# Patient Record
Sex: Male | Born: 1970 | Race: White | Hispanic: No | Marital: Married | Smoking: Never smoker
Health system: Southern US, Community
[De-identification: ages and names within clinical notes are randomized; demographics above are authoritative.]

## PROBLEM LIST (undated history)

## (undated) DIAGNOSIS — R200 Anesthesia of skin: Secondary | ICD-10-CM

## (undated) DIAGNOSIS — M545 Low back pain, unspecified: Secondary | ICD-10-CM

---

## 2020-04-10 ENCOUNTER — Other Ambulatory Visit: Payer: Self-pay | Admitting: Neurosurgery

## 2020-05-02 ENCOUNTER — Encounter (HOSPITAL_COMMUNITY): Payer: Self-pay | Admitting: Neurosurgery

## 2020-05-02 NOTE — Progress Notes (Signed)
Patient denies shortness of breath, fever, cough or chest pain.  PCP - Dr Samuel Germany, Marye Round,  Cardiologist - n/a  Chest x-ray - n/a EKG - n/a Stress Test - n/a ECHO - n/a Cardiac Cath - n/a  STOP now taking any Aspirin (unless otherwise instructed by your surgeon), Aleve, Naproxen, Ibuprofen, Motrin, Advil, Goody's, BC's, all herbal medications, fish oil, and all vitamins.   Coronavirus Screening Covid test on DOS 05/05/20. Do you have any of the following symptoms:  Cough yes/no: No Fever (>100.80F)  yes/no: No Runny nose yes/no: No Sore throat yes/no: No Difficulty breathing/shortness of breath  yes/no: No  Have you traveled in the last 14 days and where? yes/no: No  Patient verbalized understanding of instructions that were given via phone.

## 2020-05-04 NOTE — Anesthesia Preprocedure Evaluation (Addendum)
Anesthesia Evaluation  Patient identified by MRN, date of birth, ID band Patient awake    Reviewed: Allergy & Precautions, NPO status , Patient's Chart, lab work & pertinent test results  Airway Mallampati: II  TM Distance: >3 FB Neck ROM: Full    Dental no notable dental hx. (+) Dental Advisory Given   Pulmonary neg pulmonary ROS,    Pulmonary exam normal breath sounds clear to auscultation       Cardiovascular negative cardio ROS Normal cardiovascular exam Rhythm:Regular Rate:Normal     Neuro/Psych negative neurological ROS  negative psych ROS   GI/Hepatic negative GI ROS, Neg liver ROS,   Endo/Other  negative endocrine ROS  Renal/GU negative Renal ROS     Musculoskeletal negative musculoskeletal ROS (+)   Abdominal   Peds  Hematology negative hematology ROS (+)   Anesthesia Other Findings   Reproductive/Obstetrics                            Anesthesia Physical Anesthesia Plan  ASA: II  Anesthesia Plan: General   Post-op Pain Management:    Induction: Intravenous  PONV Risk Score and Plan: 3 and Ondansetron, Dexamethasone, Treatment may vary due to age or medical condition and Midazolam  Airway Management Planned: Oral ETT and Video Laryngoscope Planned  Additional Equipment:   Intra-op Plan:   Post-operative Plan: Extubation in OR  Informed Consent: I have reviewed the patients History and Physical, chart, labs and discussed the procedure including the risks, benefits and alternatives for the proposed anesthesia with the patient or authorized representative who has indicated his/her understanding and acceptance.     Dental advisory given  Plan Discussed with: CRNA  Anesthesia Plan Comments:        Anesthesia Quick Evaluation

## 2020-05-05 ENCOUNTER — Ambulatory Visit (HOSPITAL_COMMUNITY)
Admission: RE | Admit: 2020-05-05 | Discharge: 2020-05-06 | Disposition: A | Discharge status: Home / Self Care | Payer: BC Managed Care – PPO | Attending: Neurosurgery | Admitting: Neurosurgery

## 2020-05-05 ENCOUNTER — Other Ambulatory Visit: Payer: Self-pay

## 2020-05-05 ENCOUNTER — Encounter (HOSPITAL_COMMUNITY): Payer: Self-pay | Admitting: Neurosurgery

## 2020-05-05 ENCOUNTER — Encounter (HOSPITAL_COMMUNITY)
Admission: RE | Disposition: A | Discharge status: Home / Self Care | Payer: Self-pay | Source: Home / Self Care | Attending: Neurosurgery

## 2020-05-05 ENCOUNTER — Ambulatory Visit (HOSPITAL_COMMUNITY): Payer: BC Managed Care – PPO | Admitting: Certified Registered Nurse Anesthetist

## 2020-05-05 ENCOUNTER — Ambulatory Visit (HOSPITAL_COMMUNITY): Payer: BC Managed Care – PPO

## 2020-05-05 DIAGNOSIS — M5116 Intervertebral disc disorders with radiculopathy, lumbar region: Secondary | ICD-10-CM | POA: Diagnosis not present

## 2020-05-05 DIAGNOSIS — M5126 Other intervertebral disc displacement, lumbar region: Secondary | ICD-10-CM | POA: Diagnosis present

## 2020-05-05 DIAGNOSIS — Z419 Encounter for procedure for purposes other than remedying health state, unspecified: Secondary | ICD-10-CM

## 2020-05-05 DIAGNOSIS — Z20822 Contact with and (suspected) exposure to covid-19: Secondary | ICD-10-CM | POA: Insufficient documentation

## 2020-05-05 DIAGNOSIS — Z79899 Other long term (current) drug therapy: Secondary | ICD-10-CM | POA: Diagnosis not present

## 2020-05-05 HISTORY — DX: Anesthesia of skin: R20.0

## 2020-05-05 HISTORY — DX: Low back pain, unspecified: M54.50

## 2020-05-05 HISTORY — PX: LUMBAR LAMINECTOMY/DECOMPRESSION MICRODISCECTOMY: SHX5026

## 2020-05-05 LAB — CBC
HCT: 44.6 % (ref 39.0–52.0)
Hemoglobin: 14.8 g/dL (ref 13.0–17.0)
MCH: 30 pg (ref 26.0–34.0)
MCHC: 33.2 g/dL (ref 30.0–36.0)
MCV: 90.5 fL (ref 80.0–100.0)
Platelets: 216 10*3/uL (ref 150–400)
RBC: 4.93 MIL/uL (ref 4.22–5.81)
RDW: 13.5 % (ref 11.5–15.5)
WBC: 5.5 10*3/uL (ref 4.0–10.5)
nRBC: 0 % (ref 0.0–0.2)

## 2020-05-05 LAB — SARS CORONAVIRUS 2 BY RT PCR (HOSPITAL ORDER, PERFORMED IN ~~LOC~~ HOSPITAL LAB): SARS Coronavirus 2: NEGATIVE

## 2020-05-05 SURGERY — LUMBAR LAMINECTOMY/DECOMPRESSION MICRODISCECTOMY 1 LEVEL
Anesthesia: General | Site: Spine Lumbar | Laterality: Left

## 2020-05-05 MED ORDER — PANTOPRAZOLE SODIUM 40 MG IV SOLR: 40.0000 mg | Freq: Every day | INTRAVENOUS | Status: DC

## 2020-05-05 MED ORDER — 0.9 % SODIUM CHLORIDE (POUR BTL) OPTIME
TOPICAL | Status: DC | PRN
  Administered 2020-05-05: 1000 mL

## 2020-05-05 MED ORDER — ACETAMINOPHEN 650 MG RE SUPP: 650.0000 mg | RECTAL | Status: DC | PRN

## 2020-05-05 MED ORDER — SODIUM CHLORIDE 0.9% FLUSH: 3.0000 mL | INTRAVENOUS | Status: DC | PRN

## 2020-05-05 MED ORDER — HYDROMORPHONE HCL 1 MG/ML IJ SOLN
INTRAMUSCULAR | Status: AC
  Filled 2020-05-05: qty 1

## 2020-05-05 MED ORDER — ROCURONIUM BROMIDE 10 MG/ML (PF) SYRINGE
PREFILLED_SYRINGE | INTRAVENOUS | Status: AC
  Filled 2020-05-05: qty 10

## 2020-05-05 MED ORDER — GABAPENTIN 300 MG PO CAPS: 300.0000 mg | ORAL_CAPSULE | Freq: Three times a day (TID) | ORAL | Status: DC | PRN

## 2020-05-05 MED ORDER — SODIUM CHLORIDE 0.9 % IV SOLN: 250.0000 mL | INTRAVENOUS | Status: DC

## 2020-05-05 MED ORDER — PROPOFOL 10 MG/ML IV BOLUS
INTRAVENOUS | Status: AC
  Filled 2020-05-05: qty 40

## 2020-05-05 MED ORDER — SODIUM CHLORIDE 0.9% FLUSH
3.0000 mL | Freq: Two times a day (BID) | INTRAVENOUS | Status: DC
  Administered 2020-05-05: 3 mL via INTRAVENOUS

## 2020-05-05 MED ORDER — LIDOCAINE-EPINEPHRINE 1 %-1:100000 IJ SOLN
INTRAMUSCULAR | Status: AC
  Filled 2020-05-05: qty 1

## 2020-05-05 MED ORDER — MENTHOL 3 MG MT LOZG: 1.0000 | LOZENGE | OROMUCOSAL | Status: DC | PRN

## 2020-05-05 MED ORDER — DEXAMETHASONE SODIUM PHOSPHATE 10 MG/ML IJ SOLN
INTRAMUSCULAR | Status: AC
  Filled 2020-05-05: qty 1

## 2020-05-05 MED ORDER — LACTATED RINGERS IV SOLN: INTRAVENOUS | Status: DC | PRN

## 2020-05-05 MED ORDER — CEFAZOLIN SODIUM-DEXTROSE 2-3 GM-%(50ML) IV SOLR
INTRAVENOUS | Status: DC | PRN
  Administered 2020-05-05: 2 g via INTRAVENOUS

## 2020-05-05 MED ORDER — MEPERIDINE HCL 25 MG/ML IJ SOLN: 6.2500 mg | INTRAMUSCULAR | Status: DC | PRN

## 2020-05-05 MED ORDER — THROMBIN 5000 UNITS EX SOLR
CUTANEOUS | Status: DC | PRN
  Administered 2020-05-05 (×2): 5000 [IU] via TOPICAL

## 2020-05-05 MED ORDER — PHENOL 1.4 % MT LIQD: 1.0000 | OROMUCOSAL | Status: DC | PRN

## 2020-05-05 MED ORDER — FENTANYL CITRATE (PF) 250 MCG/5ML IJ SOLN
INTRAMUSCULAR | Status: AC
  Filled 2020-05-05: qty 5

## 2020-05-05 MED ORDER — ONDANSETRON HCL 4 MG/2ML IJ SOLN
INTRAMUSCULAR | Status: AC
  Filled 2020-05-05: qty 2

## 2020-05-05 MED ORDER — HYDROMORPHONE HCL 1 MG/ML IJ SOLN
0.2500 mg | INTRAMUSCULAR | Status: DC | PRN
  Administered 2020-05-05 (×2): 0.5 mg via INTRAVENOUS

## 2020-05-05 MED ORDER — LIDOCAINE 2% (20 MG/ML) 5 ML SYRINGE
INTRAMUSCULAR | Status: AC
  Filled 2020-05-05: qty 5

## 2020-05-05 MED ORDER — CHLORHEXIDINE GLUCONATE 0.12 % MT SOLN
15.0000 mL | Freq: Once | OROMUCOSAL | Status: AC
  Administered 2020-05-05: 15 mL via OROMUCOSAL
  Filled 2020-05-05: qty 15

## 2020-05-05 MED ORDER — MIDAZOLAM HCL 5 MG/5ML IJ SOLN
INTRAMUSCULAR | Status: DC | PRN
  Administered 2020-05-05: 2 mg via INTRAVENOUS

## 2020-05-05 MED ORDER — PROPOFOL 10 MG/ML IV BOLUS
INTRAVENOUS | Status: DC | PRN
  Administered 2020-05-05: 200 mg via INTRAVENOUS

## 2020-05-05 MED ORDER — TRAMADOL HCL 50 MG PO TABS: 50.0000 mg | ORAL_TABLET | Freq: Four times a day (QID) | ORAL | Status: DC | PRN

## 2020-05-05 MED ORDER — ONDANSETRON HCL 4 MG PO TABS: 4.0000 mg | ORAL_TABLET | Freq: Four times a day (QID) | ORAL | Status: DC | PRN

## 2020-05-05 MED ORDER — LIDOCAINE-EPINEPHRINE 1 %-1:100000 IJ SOLN
INTRAMUSCULAR | Status: DC | PRN
  Administered 2020-05-05: 10 mL

## 2020-05-05 MED ORDER — FENTANYL CITRATE (PF) 100 MCG/2ML IJ SOLN
INTRAMUSCULAR | Status: DC | PRN
  Administered 2020-05-05: 50 ug via INTRAVENOUS
  Administered 2020-05-05: 100 ug via INTRAVENOUS

## 2020-05-05 MED ORDER — MIDAZOLAM HCL 2 MG/2ML IJ SOLN
INTRAMUSCULAR | Status: AC
  Filled 2020-05-05: qty 2

## 2020-05-05 MED ORDER — LACTATED RINGERS IV SOLN: INTRAVENOUS | Status: DC

## 2020-05-05 MED ORDER — SUGAMMADEX SODIUM 200 MG/2ML IV SOLN
INTRAVENOUS | Status: DC | PRN
  Administered 2020-05-05: 200 mg via INTRAVENOUS
  Administered 2020-05-05: 100 mg via INTRAVENOUS

## 2020-05-05 MED ORDER — BUPIVACAINE HCL (PF) 0.25 % IJ SOLN
INTRAMUSCULAR | Status: DC | PRN
  Administered 2020-05-05: 10 mL

## 2020-05-05 MED ORDER — LIDOCAINE HCL (CARDIAC) PF 100 MG/5ML IV SOSY
PREFILLED_SYRINGE | INTRAVENOUS | Status: DC | PRN
  Administered 2020-05-05: 100 mg via INTRAVENOUS

## 2020-05-05 MED ORDER — HEMOSTATIC AGENTS (NO CHARGE) OPTIME
TOPICAL | Status: DC | PRN
  Administered 2020-05-05: 1 via TOPICAL

## 2020-05-05 MED ORDER — ONDANSETRON HCL 4 MG/2ML IJ SOLN: 4.0000 mg | Freq: Four times a day (QID) | INTRAMUSCULAR | Status: DC | PRN

## 2020-05-05 MED ORDER — BUPIVACAINE HCL (PF) 0.25 % IJ SOLN
INTRAMUSCULAR | Status: AC
  Filled 2020-05-05: qty 30

## 2020-05-05 MED ORDER — CEFAZOLIN SODIUM-DEXTROSE 2-4 GM/100ML-% IV SOLN
2.0000 g | Freq: Three times a day (TID) | INTRAVENOUS | Status: AC
  Administered 2020-05-05 (×2): 2 g via INTRAVENOUS
  Filled 2020-05-05 (×2): qty 100

## 2020-05-05 MED ORDER — THROMBIN 5000 UNITS EX SOLR
CUTANEOUS | Status: AC
  Filled 2020-05-05: qty 10000

## 2020-05-05 MED ORDER — ACETAMINOPHEN 325 MG PO TABS: 650.0000 mg | ORAL_TABLET | ORAL | Status: DC | PRN

## 2020-05-05 MED ORDER — ORAL CARE MOUTH RINSE: 15.0000 mL | Freq: Once | OROMUCOSAL | Status: AC

## 2020-05-05 MED ORDER — OXYCODONE HCL 5 MG PO TABS
10.0000 mg | ORAL_TABLET | ORAL | Status: DC | PRN
  Administered 2020-05-05 – 2020-05-06 (×5): 10 mg via ORAL
  Filled 2020-05-05 (×5): qty 2

## 2020-05-05 MED ORDER — ONDANSETRON HCL 4 MG/2ML IJ SOLN
INTRAMUSCULAR | Status: DC | PRN
  Administered 2020-05-05: 4 mg via INTRAVENOUS

## 2020-05-05 MED ORDER — SODIUM CHLORIDE 0.9 % IV SOLN
INTRAVENOUS | Status: DC | PRN
  Administered 2020-05-05: 500 mL

## 2020-05-05 MED ORDER — HYDROMORPHONE HCL 1 MG/ML IJ SOLN
0.5000 mg | INTRAMUSCULAR | Status: DC | PRN
  Administered 2020-05-05 (×2): 0.5 mg via INTRAVENOUS
  Filled 2020-05-05 (×2): qty 0.5

## 2020-05-05 MED ORDER — DEXAMETHASONE SODIUM PHOSPHATE 10 MG/ML IJ SOLN
INTRAMUSCULAR | Status: DC | PRN
Start: 2020-05-05 — End: 2020-05-05
  Administered 2020-05-05: 10 mg via INTRAVENOUS

## 2020-05-05 MED ORDER — CEFAZOLIN SODIUM-DEXTROSE 2-4 GM/100ML-% IV SOLN
INTRAVENOUS | Status: AC
  Filled 2020-05-05: qty 100

## 2020-05-05 MED ORDER — CYCLOBENZAPRINE HCL 10 MG PO TABS
10.0000 mg | ORAL_TABLET | Freq: Three times a day (TID) | ORAL | Status: DC | PRN
  Administered 2020-05-05 – 2020-05-06 (×3): 10 mg via ORAL
  Filled 2020-05-05 (×3): qty 1

## 2020-05-05 MED ORDER — ROCURONIUM BROMIDE 10 MG/ML (PF) SYRINGE
PREFILLED_SYRINGE | INTRAVENOUS | Status: DC | PRN
  Administered 2020-05-05: 10 mg via INTRAVENOUS
  Administered 2020-05-05: 60 mg via INTRAVENOUS
  Administered 2020-05-05 (×2): 10 mg via INTRAVENOUS

## 2020-05-05 MED ORDER — PANTOPRAZOLE SODIUM 40 MG PO TBEC
40.0000 mg | DELAYED_RELEASE_TABLET | Freq: Every day | ORAL | Status: DC
  Administered 2020-05-05: 40 mg via ORAL
  Filled 2020-05-05: qty 1

## 2020-05-05 MED ORDER — PROMETHAZINE HCL 25 MG/ML IJ SOLN: 6.2500 mg | INTRAMUSCULAR | Status: DC | PRN

## 2020-05-05 MED ORDER — ALUM & MAG HYDROXIDE-SIMETH 200-200-20 MG/5ML PO SUSP: 30.0000 mL | Freq: Four times a day (QID) | ORAL | Status: DC | PRN

## 2020-05-05 SURGICAL SUPPLY — 54 items
BAG DECANTER FOR FLEXI CONT (MISCELLANEOUS) ×3 IMPLANT
BAND RUBBER #18 3X1/16 STRL (MISCELLANEOUS) ×6 IMPLANT
BENZOIN TINCTURE PRP APPL 2/3 (GAUZE/BANDAGES/DRESSINGS) ×3 IMPLANT
BLADE CLIPPER SURG (BLADE) IMPLANT
BLADE SURG 11 STRL SS (BLADE) ×3 IMPLANT
BUR CUTTER 7.0 ROUND (BURR) ×3 IMPLANT
BUR MATCHSTICK NEURO 3.0 LAGG (BURR) ×3 IMPLANT
CANISTER SUCT 3000ML PPV (MISCELLANEOUS) ×3 IMPLANT
CARTRIDGE OIL MAESTRO DRILL (MISCELLANEOUS) ×1 IMPLANT
CLOSURE WOUND 1/2 X4 (GAUZE/BANDAGES/DRESSINGS) ×1
COVER WAND RF STERILE (DRAPES) IMPLANT
DECANTER SPIKE VIAL GLASS SM (MISCELLANEOUS) ×3 IMPLANT
DERMABOND ADVANCED (GAUZE/BANDAGES/DRESSINGS) ×2
DERMABOND ADVANCED .7 DNX12 (GAUZE/BANDAGES/DRESSINGS) ×1 IMPLANT
DIFFUSER DRILL AIR PNEUMATIC (MISCELLANEOUS) ×3 IMPLANT
DRAPE HALF SHEET 40X57 (DRAPES) IMPLANT
DRAPE LAPAROTOMY 100X72X124 (DRAPES) ×3 IMPLANT
DRAPE MICROSCOPE LEICA (MISCELLANEOUS) ×3 IMPLANT
DRAPE SURG 17X23 STRL (DRAPES) ×3 IMPLANT
DRSG OPSITE POSTOP 3X4 (GAUZE/BANDAGES/DRESSINGS) IMPLANT
DRSG OPSITE POSTOP 4X6 (GAUZE/BANDAGES/DRESSINGS) ×3 IMPLANT
DURAPREP 26ML APPLICATOR (WOUND CARE) ×3 IMPLANT
ELECT REM PT RETURN 9FT ADLT (ELECTROSURGICAL) ×3
ELECTRODE REM PT RTRN 9FT ADLT (ELECTROSURGICAL) ×1 IMPLANT
GAUZE 4X4 16PLY RFD (DISPOSABLE) IMPLANT
GAUZE SPONGE 4X4 12PLY STRL (GAUZE/BANDAGES/DRESSINGS) IMPLANT
GLOVE BIO SURGEON STRL SZ7 (GLOVE) ×3 IMPLANT
GLOVE BIO SURGEON STRL SZ8 (GLOVE) ×3 IMPLANT
GLOVE BIOGEL PI IND STRL 7.0 (GLOVE) ×2 IMPLANT
GLOVE BIOGEL PI INDICATOR 7.0 (GLOVE) ×4
GLOVE ECLIPSE 7.5 STRL STRAW (GLOVE) ×6 IMPLANT
GLOVE EXAM NITRILE XL STR (GLOVE) IMPLANT
GLOVE INDICATOR 8.5 STRL (GLOVE) ×3 IMPLANT
GOWN STRL REUS W/ TWL LRG LVL3 (GOWN DISPOSABLE) ×1 IMPLANT
GOWN STRL REUS W/ TWL XL LVL3 (GOWN DISPOSABLE) ×1 IMPLANT
GOWN STRL REUS W/TWL 2XL LVL3 (GOWN DISPOSABLE) ×3 IMPLANT
GOWN STRL REUS W/TWL LRG LVL3 (GOWN DISPOSABLE) ×2
GOWN STRL REUS W/TWL XL LVL3 (GOWN DISPOSABLE) ×2
KIT BASIN OR (CUSTOM PROCEDURE TRAY) ×3 IMPLANT
KIT TURNOVER KIT B (KITS) ×3 IMPLANT
NEEDLE HYPO 22GX1.5 SAFETY (NEEDLE) ×3 IMPLANT
NEEDLE SPNL 22GX3.5 QUINCKE BK (NEEDLE) ×3 IMPLANT
NS IRRIG 1000ML POUR BTL (IV SOLUTION) ×3 IMPLANT
OIL CARTRIDGE MAESTRO DRILL (MISCELLANEOUS) ×3
PACK LAMINECTOMY NEURO (CUSTOM PROCEDURE TRAY) ×3 IMPLANT
SPONGE SURGIFOAM ABS GEL SZ50 (HEMOSTASIS) ×3 IMPLANT
STRIP CLOSURE SKIN 1/2X4 (GAUZE/BANDAGES/DRESSINGS) ×2 IMPLANT
SUT VIC AB 0 CT1 18XCR BRD8 (SUTURE) ×1 IMPLANT
SUT VIC AB 0 CT1 8-18 (SUTURE) ×2
SUT VIC AB 2-0 CT1 18 (SUTURE) ×3 IMPLANT
SUT VICRYL 4-0 PS2 18IN ABS (SUTURE) ×3 IMPLANT
TOWEL GREEN STERILE (TOWEL DISPOSABLE) ×3 IMPLANT
TOWEL GREEN STERILE FF (TOWEL DISPOSABLE) ×3 IMPLANT
WATER STERILE IRR 1000ML POUR (IV SOLUTION) ×3 IMPLANT

## 2020-05-05 NOTE — Op Note (Signed)
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 so labeled the 4 5 disc base and there is a redo anterior to space below this that we labeled L5-S1 with a left L5-S1 radiculopathies  Postoperative diagnosis: Same  Procedure: Lumbar laminotomy microdiscectomy L4-5 the left with microdissection of the left L5 nerve root and microscopic discectomy  Surgeon: Jillyn Hidden [place] Dalporto  Assistant: Julien Girt  Anesthesia: General  EBL: Minimal  HPI: 49 year old gentleman is a progressive worsening back left leg pain with numbness tingling weakness in plantarflexion patient had transitional anatomy so had a 4 5 disc condition but it was behaving like a 5 1 with S1 distribution symptoms.  Due to patient's progression of clinical syndrome and imaging with failed conservative treatment I recommended laminectomy for discectomy at the L4-5 aforementioned to space I extensively went over the risks and benefits of the operation with him as well as perioperative course expectations of outcome and alternatives of surgery and he understood and agreed to proceed forward.  Operative procedure: Patient brought into the OR was due to general anesthesia positioned prone Wilson frame his back was prepped and draped in routine sterile fashion preoperative x-ray localized the appropriate level so after infiltration of 10 cc lidocaine with epi midline incision was made and Bovie letter cautery was used to take down the subcu tissue and subperiosteal dissection was carried lamina of L5 and S1 on the left.  Intraoperative x-ray confirmed identification appropriate level so laminotomy was begun with a high-speed drill and a 3 mm Kerrison punch.  Ligament was identified and removed in piecemeal fashion.  Then under microscope illumination identified the thecal sac and S1 pedicle and working through an extensive mount of adhesive inflammatory tissue with the ligament and the dura developed the plane along  the lateral aspect of the thecal sac to identify the S1 nerve root and the disc base.  I then identified a very large free fragment of disc inferior to the disc base and removed several large free fragments from underneath the descending L5 nerve root.  After all these inferior fragments were removed I was easily able to pass a hockey-stick and coronary dilator beyond and below the level of the inferior aspect of the L5 pedicle to confirm no further fragments and that nerve root was widely decompressed.  Then looked at the disc base disc base was humped up and there was disclamation there still partially contained with ligament incised the ligament cleaned out the disc base with pituitary rongeurs and Epstein curettes and of the index here and there is no further stenosis on the thecal sac or descending L5 nerve root.  Wound was then copiously irrigated meticulous hemostasis was maintained.  Gelfoam was ON top of the dura the muscle fascia approximate layers with Vicryl skin was closed running 4 subcuticular Dermabond benzoin Steri-Strips and a sterile dressing was applied patient recovery room in stable condition.  At the end the case all needle count sponge counts were correct.

## 2020-05-05 NOTE — Anesthesia Procedure Notes (Addendum)
Procedure Name: Intubation Date/Time: 05/05/2020 8:35 AM Performed by: Jed Limerick, CRNA Pre-anesthesia Checklist: Patient identified, Emergency Drugs available, Suction available, Patient being monitored and Timeout performed Patient Re-evaluated:Patient Re-evaluated prior to induction Oxygen Delivery Method: Circle system utilized Preoxygenation: Pre-oxygenation with 100% oxygen Induction Type: IV induction Ventilation: Mask ventilation without difficulty Laryngoscope Size: Glidescope and 4 Grade View: Grade I Tube type: Oral Tube size: 7.5 mm Number of attempts: 1 Airway Equipment and Method: Oral airway,  Rigid stylet and Video-laryngoscopy Placement Confirmation: ETT inserted through vocal cords under direct vision,  positive ETCO2 and breath sounds checked- equal and bilateral Secured at: 23 cm Tube secured with: Tape Dental Injury: Teeth and Oropharynx as per pre-operative assessment  Comments: Easy mask ventilation. DL with Hyacinth Meeker 3 unsuccessful. Glidescope to follow with grade 1 view. Performed by Marylee Floras, SRNA

## 2020-05-05 NOTE — H&P (Signed)
Caleel Kiner is an 49 y.o. male.   Chief Complaint: Back and left leg pain HPI: 49 year old gentleman who has had progressive worsening back and left leg pain radiating down to the outside bottom of his left foot.  He has noted some plantar flexion.  Work-up has revealed a very large disc herniation L5-S1 on the left.  Due to his weakness progression of clinical syndrome and failure of conservative treatment I recommended lumbar microdiscectomy.  I extensively gone over the risks and benefits of that operation with him as well as perioperative course expectations of outcome of surgery and he understands and agrees to proceed forward.  Past Medical History:  Diagnosis Date  . Lower back pain   . Numbness    left foot    History reviewed. No pertinent surgical history.  History reviewed. No pertinent family history. Social History:  reports that he has never smoked. He has never used smokeless tobacco. He reports that he does not drink alcohol and does not use drugs.  Allergies: No Known Allergies  Medications Prior to Admission  Medication Sig Dispense Refill  . gabapentin (NEURONTIN) 300 MG capsule Take 300 mg by mouth every 8 (eight) hours as needed (pain).    . traMADol (ULTRAM) 50 MG tablet Take 50 mg by mouth every 6 (six) hours as needed for moderate pain.      Results for orders placed or performed during the hospital encounter of 05/05/20 (from the past 48 hour(s))  SARS Coronavirus 2 by RT PCR (hospital order, performed in [facility] hospital lab) Nasopharyngeal Nasopharyngeal Swab     Status: None   Collection Time: 05/05/20  5:52 AM   Specimen: Nasopharyngeal Swab  Result Value Ref Range   SARS Coronavirus 2 NEGATIVE NEGATIVE    Comment: (NOTE) SARS-CoV-2 target nucleic acids are NOT DETECTED.  The SARS-CoV-2 RNA is generally detectable in upper and lower respiratory specimens during the acute phase of infection. The lowest concentration of SARS-CoV-2 viral  copies this assay can detect is 250 copies / mL. A negative result does not preclude SARS-CoV-2 infection and should not be used as the sole basis for treatment or other patient management decisions.  A negative result may occur with improper specimen collection / handling, submission of specimen other than nasopharyngeal swab, presence of viral mutation(s) within the areas targeted by this assay, and inadequate number of viral copies (<250 copies / mL). A negative result must be combined with clinical observations, patient history, and epidemiological information.  Fact Sheet for Patients:   BoilerBrush.com.cy  Fact Sheet for Healthcare Providers: https://pope.com/  This test is not yet approved or  cleared by the [place] FDA and has been authorized for detection and/or diagnosis of SARS-CoV-2 by FDA under an Emergency Use Authorization (EUA).  This EUA will remain in effect (meaning this test can be used) for the duration of the COVID-19 declaration under Section 564(b)(1) of the Act, 21 U.S.C. section 360bbb-3(b)(1), unless the authorization is terminated or revoked sooner.  Performed at [facility], [address] [place], [place] [address]   CBC     Status: None   Collection Time: 05/05/20  6:28 AM  Result Value Ref Range   WBC 5.5 4.0 - 10.5 K/uL   RBC 4.93 4.22 - 5.81 MIL/uL   Hemoglobin 14.8 13.0 - 17.0 g/dL   HCT 39.7 39 - 52 %   MCV 90.5 80.0 - 100.0 fL   MCH 30.0 26.0 - 34.0 pg  MCHC 33.2 30.0 - 36.0 g/dL   RDW 26.3 33.5 - 45.6 %   Platelets 216 150 - 400 K/uL   nRBC 0.0 0.0 - 0.2 %    Comment: Performed at [facility], [address], [place], [place] [address]   No results found.  Review of Systems  Musculoskeletal: Positive for back pain.  Neurological: Positive for numbness.    Blood pressure (!) 154/98, pulse 72, temperature 98.2 F (36.8 C), temperature source Oral, resp. rate 17,  height 6' (1.829 m), weight 88.5 kg, SpO2 100 %. Physical Exam HENT:     Head: Normocephalic.     Nose: Nose normal.  Eyes:     Pupils: Pupils are equal, round, and reactive to light.  Cardiovascular:     Rate and Rhythm: Normal rate.  Pulmonary:     Effort: Pulmonary effort is normal.  Abdominal:     General: Abdomen is flat.  Musculoskeletal:        General: Normal range of motion.  Skin:    General: Skin is warm.  Neurological:     General: No focal deficit present.     Mental Status: He is alert.     Comments: Strength 4+ out of 5 left gastroc otherwise 5/5 both lower extremities.  Decreased sensation S1      Assessment/Plan 49 year old presents for left-sided L5-S1 microdiscectomy  Mariam Dollar, MD 05/05/2020, 8:16 AM

## 2020-05-05 NOTE — Transfer of Care (Signed)
Immediate Anesthesia Transfer of Care Note  Patient: Thomas Ramirez  Procedure(s) Performed: Microdiscectomy - Lumbar Four-Five - left (Left Spine Lumbar)  Patient Location: PACU  Anesthesia Type:General  Level of Consciousness: drowsy  Airway & Oxygen Therapy: Patient Spontanous Breathing  Post-op Assessment: Report given to RN and Post -op Vital signs reviewed and stable  Post vital signs: Reviewed and stable  Last Vitals:  Vitals Value Taken Time  BP 138/86 05/05/20 1019  Temp    Pulse 64 05/05/20 1022  Resp 12 05/05/20 1022  SpO2 96 % 05/05/20 1022  Vitals shown include unvalidated device data.  Last Pain:  Vitals:   05/05/20 1020  TempSrc:   PainSc: (P) Asleep      Patients Stated Pain Goal: 2 (05/05/20 9169)  Complications: No complications documented.

## 2020-05-06 ENCOUNTER — Encounter (HOSPITAL_COMMUNITY): Payer: Self-pay | Admitting: Neurosurgery

## 2020-05-06 DIAGNOSIS — M5116 Intervertebral disc disorders with radiculopathy, lumbar region: Secondary | ICD-10-CM | POA: Diagnosis not present

## 2020-05-06 MED ORDER — OXYCODONE-ACETAMINOPHEN 7.5-325 MG PO TABS: 1.0000 | ORAL_TABLET | ORAL | 0 refills | Status: AC | PRN

## 2020-05-06 MED ORDER — METHOCARBAMOL 500 MG PO TABS: 500.0000 mg | ORAL_TABLET | Freq: Four times a day (QID) | ORAL | 0 refills | Status: AC

## 2020-05-06 NOTE — Anesthesia Postprocedure Evaluation (Signed)
Anesthesia Post Note  Patient: Thomas Ramirez Pulse  Procedure(s) Performed: Microdiscectomy - Lumbar Four-Five - left (Left Spine Lumbar)     Patient location during evaluation: PACU Anesthesia Type: General Level of consciousness: sedated and patient cooperative Pain management: pain level controlled Vital Signs Assessment: post-procedure vital signs reviewed and stable Respiratory status: spontaneous breathing Cardiovascular status: stable Anesthetic complications: no   No complications documented.  Last Vitals:  Vitals:   05/06/20 0416 05/06/20 0728  BP: 137/83 (!) 141/76  Pulse: 77 75  Resp: 18 18  Temp: 36.9 C 36.7 C  SpO2: 99% 98%    Last Pain:  Vitals:   05/06/20 1000  TempSrc:   PainSc: 8                  [employer]

## 2020-05-06 NOTE — Plan of Care (Signed)
Pt and wife given D/C instructions with verbal understanding. Rx's were sent to the pharmacy by MD. Pt's incision is clean and dry with no sign of infection. Pt's IV was removed prior to D/C. Pt D/C'd home via wheelchair per MD order. Pt is stable @ D/C and has no other needs at this time. Jesi Jurgens, RN  

## 2020-05-06 NOTE — Discharge Summary (Signed)
Physician Discharge Summary  Patient ID: Thomas Ramirez MRN: [id number] DOB/AGE: [birth year] 49 y.o.  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 The patient tolerated the procedure well and was taken to the recovery room and then to the floor in stable condition. The hospital course was routine. There were no complications. The wound remained clean dry and intact. Pt had appropriate back soreness. No complaints of leg pain or new N/T/W. The patient remained afebrile with stable vital signs, and tolerated a regular diet. The patient continued to increase activities, and pain was well controlled with oral pain medications.   Consults: None  Significant Diagnostic Studies:  Results for orders placed or performed during the hospital encounter of 05/05/20  SARS Coronavirus 2 by RT PCR (hospital order, performed in [facility] hospital lab) Nasopharyngeal Nasopharyngeal Swab   Specimen: Nasopharyngeal Swab  Result Value Ref Range   SARS Coronavirus 2 NEGATIVE NEGATIVE  CBC  Result Value Ref Range   WBC 5.5 4.0 - 10.5 K/uL   RBC 4.93 4.22 - 5.81 MIL/uL   Hemoglobin 14.8 13.0 - 17.0 g/dL   HCT 52.7 39 - 52 %   MCV 90.5 80.0 - 100.0 fL   MCH 30.0 26.0 - 34.0 pg   MCHC 33.2 30.0 - 36.0 g/dL   RDW 78.2 42.3 - 53.6 %   Platelets 216 150 - 400 K/uL   nRBC 0.0 0.0 - 0.2 %    DG Lumbar Spine 2-3 Views  Result Date: 05/05/2020 CLINICAL DATA:  Opening films for micro discectomy and L4-5 fusion. EXAM: LUMBAR SPINE - 2-3 VIEW COMPARISON:  MR lumbar spine  04/03/2020. FINDINGS: Two intraoperative cross-table lateral views of the lumbar spine are submitted and are compared with MR lumbar spine 04/03/2020. Numbering system utilized on that study is preserved. On the first image, taken at 0851 hours, surgical instrument tip projects posterior to the L5 vertebral body. The second image, taken at 0901 hours, shows an instrument tip projecting posterior to the L4-5 facet joints. Degenerative changes at the L4-5 and L5-S1 better described on 04/03/2020. IMPRESSION: Intraoperative localization at L4-5. Electronically Signed   By: Thomas Ramirez M.D.   On: 05/05/2020 12:17    Antibiotics:  Anti-infectives (From admission, onward)   Start     Dose/Rate Route Frequency Ordered Stop   05/05/20 1630  ceFAZolin (ANCEF) IVPB 2g/100 mL premix        2 g 200 mL/hr over 30 Minutes Intravenous Every 8 hours 05/05/20 1145 05/06/20 0016   05/05/20 0911  bacitracin 50,000 Units in sodium chloride 0.9 % 500 mL irrigation  Status:  Discontinued          As needed 05/05/20 0914 05/05/20 1016   05/05/20 0811  ceFAZolin (ANCEF) 2-4 GM/100ML-% IVPB       Note to Pharmacy: Edwina Barth   : cabinet override      05/05/20 0811 05/05/20 2014      Discharge Exam: Blood pressure (!) 141/76, pulse 75, temperature 98.1 F (36.7 C), temperature source Oral, resp. rate 18, height 6' (1.829 m), weight 88.5  kg, SpO2 98 %. Neurologic: Grossly normal ambualting and voiding well  Discharge Medications:   Allergies as of 05/06/2020   No Known Allergies     Medication List    TAKE these medications   gabapentin 300 MG capsule Commonly known as: NEURONTIN Take 300 mg by mouth every 8 (eight) hours as needed (pain).   methocarbamol 500 MG tablet Commonly known as: Robaxin Take 1 tablet (500 mg total) by mouth 4 (four) times daily.   oxyCODONE-acetaminophen 7.5-325 MG tablet Commonly known as: Percocet Take 1 tablet by mouth every 4 (four) hours as needed for severe pain.    traMADol 50 MG tablet Commonly known as: ULTRAM Take 50 mg by mouth every 6 (six) hours as needed for moderate pain.       Disposition: home   Final Dx: Left Lum Lam L4-5  Discharge Instructions     Remove dressing in 72 hours   Complete by: As directed    Call MD for:  difficulty breathing, headache or visual disturbances   Complete by: As directed    Call MD for:  hives   Complete by: As directed    Call MD for:  persistant nausea and vomiting   Complete by: As directed    Call MD for:  redness, tenderness, or signs of infection (pain, swelling, redness, odor or green/yellow discharge around incision site)   Complete by: As directed    Call MD for:  severe uncontrolled pain   Complete by: As directed    Call MD for:  temperature >100.4   Complete by: As directed    Diet - low sodium heart healthy   Complete by: As directed    Driving Restrictions   Complete by: As directed    No driving for 2 weeks, no riding in the car for 1 week   Increase activity slowly   Complete by: As directed    Lifting restrictions   Complete by: As directed    No lifting more than 8 lbs         Signed: Tiana Loft [name] 05/06/2020, 7:59 AM

## 2020-05-06 NOTE — Evaluation (Signed)
Physical Therapy Evaluation and Discharge Patient Details Name: Thomas Ramirez MRN: [id number] DOB: Jul 13, 1971 Today's Date: 05/06/2020   History of Present Illness  Pt is a 49 y/o male who presents s/p L4-L5 laminectomy/decompression on 05/05/2020. No pertinent PMH noted.   Clinical Impression  Patient evaluated by Physical Therapy with no further acute PT needs identified. All education has been completed and the patient has no further questions. Pt was able to demonstrate transfers and ambulation with gross supervision for safety and RW for support. Pt did not demonstrate any gross balance deficits, however the walker was helpful for pain control. Pt was educated on precautions, appropriate activity progression, and car transfer. See below for any follow-up Physical Therapy or equipment needs. PT is signing off. Thank you for this referral.     Follow Up Recommendations No PT follow up;Supervision - Intermittent    Equipment Recommendations  None recommended by PT    Recommendations for Other Services       Precautions / Restrictions Precautions Precautions: Fall;Back Precaution Booklet Issued: Yes (comment) Precaution Comments: Reviewed precaution sheet and pt/wife educated on precautions during functional mobility.       Mobility  Bed Mobility Overal bed mobility: Needs Assistance Bed Mobility: Rolling;Sidelying to Sit Rolling: Modified independent (Device/Increase time) Sidelying to sit: Supervision       General bed mobility comments: Increased time. Pt very guarded and required cues for step-by-step completion of log roll  Transfers Overall transfer level: Needs assistance Equipment used: None Transfers: Sit to/from Stand Sit to Stand: Supervision         General transfer comment: Close supervision for safety required. Pt stood from an elevated bed height. Increased time taken to achieve full stand.   Ambulation/Gait Ambulation/Gait assistance:  Supervision Gait Distance (Feet): 250 Feet Assistive device: Rolling walker (2 wheeled);None Gait Pattern/deviations: Step-through pattern;Decreased stride length Gait velocity: Decreased Gait velocity interpretation: <1.31 ft/sec, indicative of household ambulator General Gait Details: Extremely slow and effortful due to pain. Pt initially without AD and given walker for pain control. Pt reports improvement in pain with UE support and was able to improve gait speed  Stairs Stairs: Yes   Stair Management: One rail Right;Step to pattern;Forwards Number of Stairs: 5 General stair comments: Wife present and educated on safe assist for patient. Pt was able to complete 5 stairs with min assist and increased time.   Wheelchair Mobility    Modified Rankin (Stroke Patients Only)       Balance Overall balance assessment: Mild deficits observed, not formally tested                                           Pertinent Vitals/Pain Pain Assessment: Faces Faces Pain Scale: Hurts whole lot Pain Location: Incision site/back Pain Descriptors / Indicators: Operative site guarding;Aching Pain Intervention(s): Limited activity within patient's tolerance;Monitored during session;Repositioned    Home Living Family/patient expects to be discharged to:: Private residence Living Arrangements: Spouse/significant other Available Help at Discharge: Family;Available 24 hours/day Type of Home: House Home Access: Stairs to enter Entrance Stairs-Rails: Doctor, general practice of Steps: 5 Home Layout: One level Home Equipment: Walker - 2 wheels;Bedside commode;Toilet riser      Prior Function Level of Independence: Independent               Hand Dominance        Extremity/Trunk Assessment  Upper Extremity Assessment Upper Extremity Assessment: Overall WFL for tasks assessed    Lower Extremity Assessment Lower Extremity Assessment: Generalized weakness     Cervical / Trunk Assessment Cervical / Trunk Assessment: Other exceptions Cervical / Trunk Exceptions: s/p surgery  Communication   Communication: No difficulties  Cognition Arousal/Alertness: Awake/alert Behavior During Therapy: WFL for tasks assessed/performed Overall Cognitive Status: Within Functional Limits for tasks assessed                                        General Comments      Exercises     Assessment/Plan    PT Assessment Patent does not need any further PT services  PT Problem List         PT Treatment Interventions      PT Goals (Current goals can be found in the Care Plan section)  Acute Rehab PT Goals Patient Stated Goal: Home this morning PT Goal Formulation: All assessment and education complete, DC therapy    Frequency     Barriers to discharge        Co-evaluation               AM-PAC PT "6 Clicks" Mobility  Outcome Measure Help needed turning from your back to your side while in a flat bed without using bedrails?: None Help needed moving from lying on your back to sitting on the side of a flat bed without using bedrails?: None Help needed moving to and from a bed to a chair (including a wheelchair)?: None Help needed standing up from a chair using your arms (e.g., wheelchair or bedside chair)?: None Help needed to walk in hospital room?: None Help needed climbing 3-5 steps with a railing? : A Little 6 Click Score: 23    End of Session Equipment Utilized During Treatment: Gait belt Activity Tolerance: Patient limited by pain Patient left: in bed;with call bell/phone within reach;with family/visitor present (Sitting EOB) Nurse Communication: Mobility status PT Visit Diagnosis: Unsteadiness on feet (R26.81);Pain Pain - part of body:  (Back)    Time: 5462-7035 PT Time Calculation (min) (ACUTE ONLY): 36 min   Charges:   PT Evaluation $PT Eval Low Complexity: 1 Low PT Treatments $Gait Training: 8-22 mins         Conni Slipper, PT, DPT Acute Rehabilitation Services Pager: [phone] Office: [phone]   Marylynn Pearson 05/06/2020, 1:03 PM

## 2020-05-06 NOTE — Discharge Instructions (Signed)

## 2022-02-15 IMAGING — CR DG LUMBAR SPINE 2-3V
1 series · 1 of 1 positions shown · non-contrast
Comparison: MR lumbar spine 04/03/2020.

CLINICAL DATA: Opening films for micro discectomy and L4-5 fusion.

EXAM:
LUMBAR SPINE - 2-3 VIEW

[lateral]
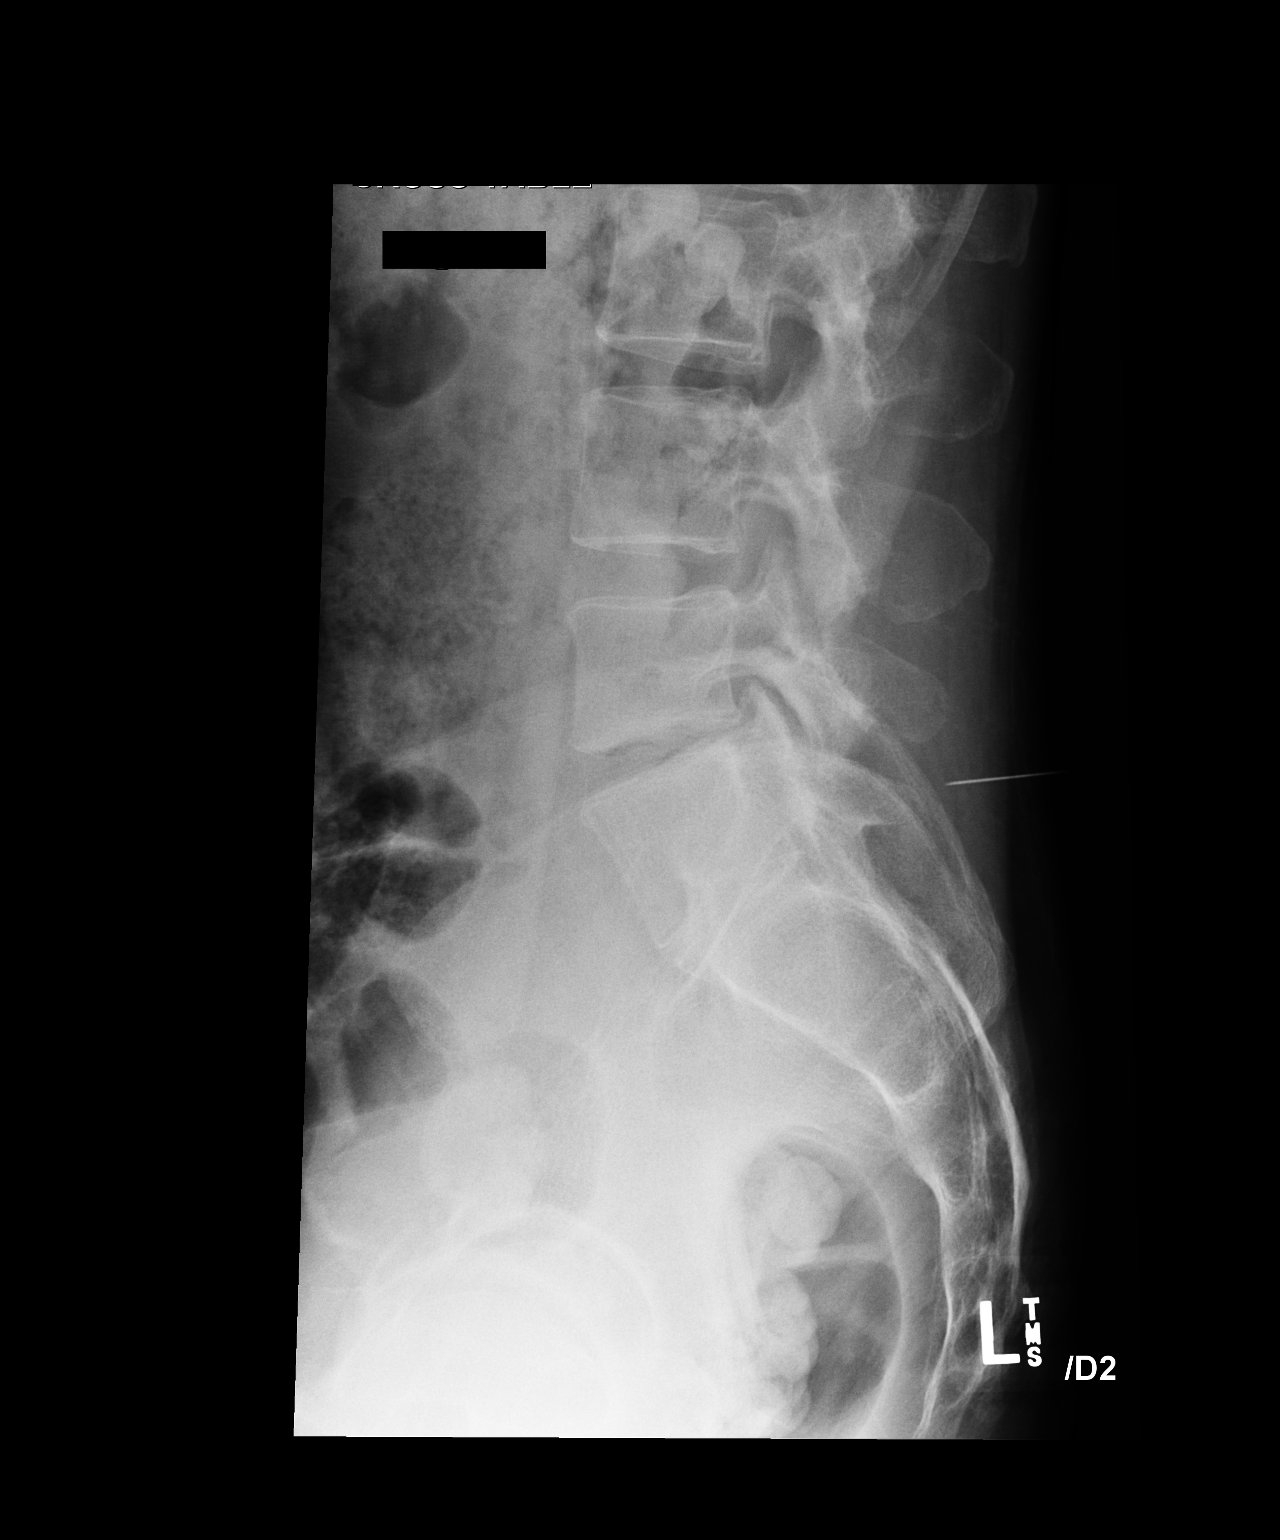

[1 of 1 positions shown; findings below may reference images not displayed]

FINDINGS: Two intraoperative cross-table lateral views of the lumbar spine are
submitted and are compared with MR lumbar spine 04/03/2020.
Numbering system utilized on that study is preserved. On the first
image, taken at 0483 hours, surgical instrument tip projects
posterior to the L5 vertebral body. The second image, taken at 9496
hours, shows an instrument tip projecting posterior to the L4-5
facet joints. Degenerative changes at the L4-5 and L5-S1 better
described on 04/03/2020.
IMPRESSION: Intraoperative localization at L4-5.
